# Patient Record
Sex: Female | Born: 1995 | Race: White | Hispanic: No | Marital: Married | Smoking: Never smoker
Health system: Southern US, Community
[De-identification: ages and names within clinical notes are randomized; demographics above are authoritative.]

## PROBLEM LIST (undated history)

## (undated) DIAGNOSIS — R202 Paresthesia of skin: Secondary | ICD-10-CM

## (undated) DIAGNOSIS — N319 Neuromuscular dysfunction of bladder, unspecified: Secondary | ICD-10-CM

## (undated) DIAGNOSIS — N3010 Interstitial cystitis (chronic) without hematuria: Secondary | ICD-10-CM

## (undated) DIAGNOSIS — M5416 Radiculopathy, lumbar region: Secondary | ICD-10-CM

## (undated) DIAGNOSIS — M792 Neuralgia and neuritis, unspecified: Secondary | ICD-10-CM

## (undated) DIAGNOSIS — M797 Fibromyalgia: Secondary | ICD-10-CM

## (undated) HISTORY — DX: Radiculopathy, lumbar region: M54.16

## (undated) HISTORY — DX: Fibromyalgia: M79.7

## (undated) HISTORY — DX: Interstitial cystitis (chronic) without hematuria: N30.10

## (undated) HISTORY — DX: Paresthesia of skin: R20.2

## (undated) HISTORY — PX: INTERSTIM IMPLANT PLACEMENT: SHX5130

## (undated) HISTORY — PX: BLADDER SURGERY: SHX569

## (undated) HISTORY — PX: URETHRAL DILATION: SUR417

## (undated) HISTORY — DX: Neuralgia and neuritis, unspecified: M79.2

---

## 2018-07-01 ENCOUNTER — Encounter (HOSPITAL_COMMUNITY): Payer: Self-pay | Admitting: *Deleted

## 2018-07-01 ENCOUNTER — Emergency Department (HOSPITAL_COMMUNITY)
Admission: EM | Admit: 2018-07-01 | Discharge: 2018-07-02 | Disposition: A | Discharge status: Home / Self Care | Payer: BLUE CROSS/BLUE SHIELD | Attending: Emergency Medicine | Admitting: Emergency Medicine

## 2018-07-01 ENCOUNTER — Other Ambulatory Visit: Payer: Self-pay

## 2018-07-01 DIAGNOSIS — R1084 Generalized abdominal pain: Secondary | ICD-10-CM | POA: Diagnosis present

## 2018-07-01 DIAGNOSIS — Z79899 Other long term (current) drug therapy: Secondary | ICD-10-CM | POA: Diagnosis not present

## 2018-07-01 DIAGNOSIS — R109 Unspecified abdominal pain: Secondary | ICD-10-CM

## 2018-07-01 HISTORY — DX: Neuromuscular dysfunction of bladder, unspecified: N31.9

## 2018-07-01 NOTE — ED Triage Notes (Signed)
Pt started having R flank pain yesterday. Contacted her PCP who recommended coming to the ED for r/o appendicitis, kidney stone or ovarian cyst. Pt reports pain started at umbilicus then is now RUQ and R flank. Denies vomiting, c/o nausea

## 2018-07-02 LAB — COMPREHENSIVE METABOLIC PANEL
ALBUMIN: 3.8 g/dL (ref 3.5–5.0)
ALT: 20 U/L (ref 0–44)
AST: 20 U/L (ref 15–41)
Alkaline Phosphatase: 63 U/L (ref 38–126)
Anion gap: 8 (ref 5–15)
BUN: 20 mg/dL (ref 6–20)
CO2: 28 mmol/L (ref 22–32)
Calcium: 9.3 mg/dL (ref 8.9–10.3)
Chloride: 103 mmol/L (ref 98–111)
Creatinine, Ser: 0.79 mg/dL (ref 0.44–1.00)
GFR calc Af Amer: >60 mL/min (ref >60)
GLUCOSE: 102 mg/dL — AB (ref 70–99)
POTASSIUM: 3.8 mmol/L (ref 3.5–5.1)
SODIUM: 139 mmol/L (ref 135–145)
Total Bilirubin: 0.4 mg/dL (ref 0.3–1.2)
Total Protein: 6.7 g/dL (ref 6.5–8.1)

## 2018-07-02 LAB — URINALYSIS, ROUTINE W REFLEX MICROSCOPIC
Bilirubin Urine: NEGATIVE
GLUCOSE, UA: NEGATIVE mg/dL
HGB URINE DIPSTICK: NEGATIVE
KETONES UR: NEGATIVE mg/dL
LEUKOCYTES UA: NEGATIVE
Nitrite: NEGATIVE
Protein, ur: NEGATIVE mg/dL
Specific Gravity, Urine: 1.019 (ref 1.005–1.030)
pH: 6 (ref 5.0–8.0)

## 2018-07-02 LAB — CBC
HEMATOCRIT: 37.9 % (ref 36.0–46.0)
Hemoglobin: 12.5 g/dL (ref 12.0–15.0)
MCH: 29.6 pg (ref 26.0–34.0)
MCHC: 33 g/dL (ref 30.0–36.0)
MCV: 89.6 fL (ref 78.0–100.0)
Platelets: 301 10*3/uL (ref 150–400)
RBC: 4.23 MIL/uL (ref 3.87–5.11)
RDW: 13 % (ref 11.5–15.5)
WBC: 9.9 10*3/uL (ref 4.0–10.5)

## 2018-07-02 LAB — I-STAT BETA HCG BLOOD, ED (MC, WL, AP ONLY): I-stat hCG, quantitative: <5 m[IU]/mL (ref <5)

## 2018-07-02 LAB — LIPASE, BLOOD: LIPASE: 45 U/L (ref 11–51)

## 2018-07-02 MED ORDER — CYCLOBENZAPRINE HCL 10 MG PO TABS: 10.0000 mg | ORAL_TABLET | Freq: Two times a day (BID) | ORAL | 0 refills | Status: DC | PRN

## 2018-07-02 MED ORDER — IBUPROFEN 800 MG PO TABS: 800.0000 mg | ORAL_TABLET | Freq: Three times a day (TID) | ORAL | 0 refills | Status: DC

## 2018-07-02 NOTE — ED Notes (Signed)
Pt. Unable to sign due to computer malfunction. No further questions at this time.

## 2018-07-02 NOTE — ED Provider Notes (Signed)
[facility]   CSN: [id number] Arrival date & time: 07/01/18  2325     History   Chief Complaint Chief Complaint  Patient presents with  . Flank Pain    HPI Alexis Shannon is a 22 y.o. female.  Patient presents to the emergency department with a chief complaint of right flank pain.  She states that she noticed pain in her right low back about 3 or 4 days ago.  She denies any injury or traumatic event.  She denies any hematuria, dysuria.  Denies any vaginal discharge or bleeding.  Denies any fever, chills, or vomiting.  She states that she has had some slight nausea.  She has not taken anything for symptoms.  She denies any other associated symptoms her symptoms are worsened with palpation and with movement.  The history is provided by the patient. No language interpreter was used.    Past Medical History:  Diagnosis Date  . Neurogenic bladder     There are no active problems to display for this patient.   Past Surgical History:  Procedure Laterality Date  . INTERSTIM IMPLANT PLACEMENT       OB History   None      Home Medications    Prior to Admission medications   Medication Sig Start Date End Date Taking? Authorizing Provider  EMGALITY 120 MG/ML SOAJ Inject 120 mg into the muscle every 30 (thirty) days.  06/29/18  Yes [provider]  gabapentin (NEURONTIN) 300 MG capsule Take 900 mg by mouth 3 (three) times daily. 05/27/18  Yes [provider]  rizatriptan (MAXALT) 10 MG tablet Take 10 mg by mouth as needed for migraine.  06/28/18  Yes [provider]    Family History No family history on file.  Social History Social History   Tobacco Use  . Smoking status: Never Smoker  . Smokeless tobacco: Never Used  Substance Use Topics  . Alcohol use: Never    Frequency: Never  . Drug use: Never     Allergies   Ciprofloxacin hcl   Review of Systems Review of Systems  All  other systems reviewed and are negative.    Physical Exam Updated Vital Signs BP 94/62   Pulse 69   Temp 97.8 F (36.6 C) (Oral)   Resp 17   LMP 06/14/2018   SpO2 98%   Physical Exam  Constitutional: She is oriented to person, place, and time. She appears well-developed and well-nourished.  HENT:  Head: Normocephalic and atraumatic.  Eyes: Pupils are equal, round, and reactive to light. Conjunctivae and EOM are normal.  Neck: Normal range of motion. Neck supple.  Cardiovascular: Normal rate and regular rhythm. Exam reveals no gallop and no friction rub.  No murmur heard. Pulmonary/Chest: Effort normal and breath sounds normal. No respiratory distress. She has no wheezes. She has no rales. She exhibits no tenderness.  Abdominal: Soft. Bowel sounds are normal. She exhibits no distension and no mass. There is no tenderness. There is no rebound and no guarding.  Right flank muscle tenderness to palpation No right lower abdominal tenderness  Musculoskeletal: Normal range of motion. She exhibits no edema or tenderness.  Neurological: She is alert and oriented to person, place, and time.  Skin: Skin is warm and dry.  Psychiatric: She has a normal mood and affect. Her behavior is normal. Judgment and thought content normal.  Nursing note and vitals reviewed.    ED Treatments / Results  Labs (all labs ordered are listed, but only abnormal results are displayed) Labs Reviewed  COMPREHENSIVE METABOLIC PANEL - Abnormal; Notable for the following components:      Result Value   Glucose, Bld 102 (*)    All other components within normal limits  LIPASE, BLOOD  CBC  URINALYSIS, ROUTINE W REFLEX MICROSCOPIC  I-STAT BETA HCG BLOOD, ED ([facility], [facility], [facility] ONLY)    EKG None  Radiology No results found.  Procedures Procedures (including critical care time)  Medications Ordered in ED Medications - No data to display   Initial Impression / Assessment and Plan / ED Course  I have  reviewed the triage vital signs and the nursing notes.  Pertinent labs & imaging results that were available during my care of the patient were reviewed by me and considered in my medical decision making (see chart for details).     Patient with right-sided low back tenderness to palpation.  No certain etiology.  No rash.  Vital signs are stable.  Patient is in no acute distress.  She is nontoxic-appearing.  Urinalysis negative.  Patient is not pregnant.  Doubt appendicitis, she has no McBurney point tenderness, nor does she have any focal abdominal tenderness.  Doubt UTI, his urinalysis is reassuring.  Doubt kidney stone.  Will discharge home with some Flexeril and NSAIDs.  Recommend PCP follow-up.  Final Clinical Impressions(s) / ED Diagnoses   Final diagnoses:  Flank pain    ED Discharge Orders        Ordered    cyclobenzaprine (FLEXERIL) 10 MG tablet  2 times daily PRN     07/02/18 0420    ibuprofen (ADVIL,MOTRIN) 800 MG tablet  3 times daily     07/02/18 0420       Roxy Horseman, PA-C 07/02/18 0421    Dione Booze, MD 07/02/18 [phone]

## 2018-08-24 ENCOUNTER — Encounter: Payer: Self-pay | Admitting: Gastroenterology

## 2018-09-30 ENCOUNTER — Ambulatory Visit (INDEPENDENT_AMBULATORY_CARE_PROVIDER_SITE_OTHER): Payer: BLUE CROSS/BLUE SHIELD | Admitting: Gastroenterology

## 2018-09-30 ENCOUNTER — Other Ambulatory Visit (INDEPENDENT_AMBULATORY_CARE_PROVIDER_SITE_OTHER): Payer: BLUE CROSS/BLUE SHIELD

## 2018-09-30 ENCOUNTER — Encounter

## 2018-09-30 ENCOUNTER — Encounter: Payer: Self-pay | Admitting: Gastroenterology

## 2018-09-30 VITALS — BP 90/70 | HR 68 | Ht 66.0 in | Wt 149.2 lb

## 2018-09-30 DIAGNOSIS — R1084 Generalized abdominal pain: Secondary | ICD-10-CM | POA: Diagnosis not present

## 2018-09-30 DIAGNOSIS — R140 Abdominal distension (gaseous): Secondary | ICD-10-CM

## 2018-09-30 DIAGNOSIS — R110 Nausea: Secondary | ICD-10-CM

## 2018-09-30 DIAGNOSIS — K5900 Constipation, unspecified: Secondary | ICD-10-CM

## 2018-09-30 LAB — TSH: TSH: 1.74 u[IU]/mL (ref 0.35–4.50)

## 2018-09-30 LAB — CALCIUM: Calcium: 9.4 mg/dL (ref 8.4–10.5)

## 2018-09-30 NOTE — Progress Notes (Signed)
Referring Provider: [facility] Primary Care Physician:  [facility]  Reason for Consultation:  Severe constipation   IMPRESSION:  Constipation with associated bloating not responding to medical therapy Blood in the stool in August 2019 Abdominal pain Bloating Early morning nausea Family history of colon cancer (father at age 23)  Etiology of symptoms is unclear. Differential is broad given the cascade of symptoms.   PLAN: Sitzmark study on her current regimen of Dulcolax and Miralax and probiotics Stool/symptom diary EGD and colonoscopy Trial of linaclotide 145 mcg daily after the Sitzmark study, may increase to 290 mcg if no response  I consented the patient at the bedside today discussing the risks, benefits, and alternatives to endoscopic evaluation. In particular, we discussed the risks that include, but are not limited to, reaction to medication, cardiopulmonary compromise, bleeding requiring blood transfusion, aspiration resulting in pneumonia, perforation requiring surgery, and even death. We reviewed the risk of missed lesion including polyps or even cancer. The patient acknowledges these risks and asks that we proceed.    HPI: Alexis Shannon is a 22 y.o. female with fibromyalgia, interstitial cystitis, lumbar radiculopathy, neuralgia, paresthesia of skin who is seen in consultation for constipation bloating, abdominal pain, loss of appetite, and nausea.  The history is obtained through the patient as well as referral records. Her mother accompanies her to this appointment. 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   She reports a longstanding history of constipation, first diagnosed in 3rd grade. She has avoided dairy since that time. But, this was not bothersome until the acute onset of symptoms in mid-August. Developed "severe" constipation not  responding to two dulcolax and Miralax TID, as well as Ally probiotics/prebiotics, increased fiber in the diet with increased oatmeal consumption. She is also avoiding gluten.  She will have a BM every 3-4 days. There is urge but incomplete evacuation. She has gone as long as 1 week without a bowel movement. She will require [employer] weekly when her daily regimen is not working.   Associated bloating to the point that her clothes won't fit, intermittent blood on the stool/on the toilet paper in early August, early satiety, early morning nausea and pain superior to the umbilicus. Her stools are now firm balls instead of her baseline banana shape.   Her Physical Therapist, Kayren Eaves, at [facility] told her that her cardiac sphincter was tight in her intestines.  Father died with metastatic colon cancer at age 53.  There is no other known family history of colon cancer or polyps.  No prior evaluation by gastroenterology. No prior endoscopy.    Past Medical History:  Diagnosis Date  . Fibromyalgia   . Interstitial cystitis   . Lumbar radiculopathy   . Neuralgia   . Neurogenic bladder   . Paresthesia of skin     Past Surgical History:  Procedure Laterality Date  . BLADDER SURGERY     dilation  . INTERSTIM IMPLANT PLACEMENT     part 1&2  . URETHRAL DILATION      Prior to Admission medications   Medication Sig Start Date End Date Taking? Authorizing Provider  cyclobenzaprine (FLEXERIL) 10 MG tablet Take 1 tablet (10 mg total) by mouth 2 (two) times daily as needed for muscle spasms. 07/02/18   Roxy Horseman, PA-C  EMGALITY 120 MG/ML SOAJ Inject 120 mg into the muscle every 30 (thirty) days.  06/29/18   [provider]  gabapentin (NEURONTIN) 300 MG capsule Take 900 mg by mouth 3 (three) times daily. 05/27/18   [provider]  ibuprofen (ADVIL,MOTRIN) 800 MG tablet Take 1 tablet (800 mg total) by mouth 3 (three) times daily. 07/02/18   Roxy Horseman, PA-C    mirabegron ER (MYRBETRIQ) 50 MG TB24 tablet Take 50 mg by mouth daily.    [provider]  pantoprazole (PROTONIX) 40 MG tablet Take 40 mg by mouth daily.    [provider]  promethazine (PHENERGAN) 12.5 MG tablet Take 12.5 mg by mouth every 6 (six) hours as needed for nausea or vomiting.    [provider]  rizatriptan (MAXALT) 10 MG tablet Take 10 mg by mouth as needed for migraine.  06/28/18   [provider]    Current Outpatient Medications  Medication Sig Dispense Refill  . cyclobenzaprine (FLEXERIL) 10 MG tablet Take 1 tablet (10 mg total) by mouth 2 (two) times daily as needed for muscle spasms. 20 tablet 0  . EMGALITY 120 MG/ML SOAJ Inject 120 mg into the muscle every 30 (thirty) days.   6  . gabapentin (NEURONTIN) 300 MG capsule Take 900 mg by mouth 3 (three) times daily.  6  . ibuprofen (ADVIL,MOTRIN) 800 MG tablet Take 1 tablet (800 mg total) by mouth 3 (three) times daily. 21 tablet 0  . mirabegron ER (MYRBETRIQ) 50 MG TB24 tablet Take 50 mg by mouth daily.    . pantoprazole (PROTONIX) 40 MG tablet Take 40 mg by mouth daily.    . promethazine (PHENERGAN) 12.5 MG tablet Take 12.5 mg by mouth every 6 (six) hours as needed for nausea or vomiting.    . rizatriptan (MAXALT) 10 MG tablet Take 10 mg by mouth as needed for migraine.   5   No current facility-administered medications for this visit.     Allergies as of 09/30/2018 - Review Complete 09/30/2018  Allergen Reaction Noted  . Ciprofloxacin hcl Rash 07/01/2018    Family History  Problem Relation Age of Onset  . Kidney disease Mother   . Colon cancer Father 44       mets  . Liver cancer Father   . Lung cancer Father     Social History   Socioeconomic History  . Marital status: Single    Spouse name: Not on file  . Number of children: 0  . Years of education: Not on file  . Highest education level: Not on file  Occupational History  . Not on file  Social Needs  . Financial  resource strain: Not on file  . Food insecurity:    Worry: Not on file    Inability: Not on file  . Transportation needs:    Medical: Not on file    Non-medical: Not on file  Tobacco Use  . Smoking status: Never Smoker  . Smokeless tobacco: Never Used  Substance and Sexual Activity  . Alcohol use: Never    Frequency: Never  . Drug use: Never  . Sexual activity: Not on file  Lifestyle  . Physical activity:    Days per week: Not on file    Minutes per session: Not on file  . Stress: Not on file  Relationships  . Social connections:    Talks on phone: Not on file    Gets together: Not on file    Attends religious service: Not on file    Active member of club or organization: Not on  file    Attends meetings of clubs or organizations: Not on file    Relationship status: Not on file  . Intimate partner violence:    Fear of current or ex partner: Not on file    Emotionally abused: Not on file    Physically abused: Not on file    Forced sexual activity: Not on file  Other Topics Concern  . Not on file  Social History Narrative  . Not on file    Review of Systems: 12 system ROS is negative except as noted above with the additions of anxiety, back pain, depression, fatigue headaches, menstrual pain, muscle cramps, night sweats, insomnia excessive thirst, excessive urination, urinary pain, urinary.   Physical Exam: Vital signs were reviewed. General:   Alert, well-nourished, pleasant and cooperative in NAD. She notes ongoing bloating at the time of my evaluation.  Head:  Normocephalic and atraumatic. Eyes:  Sclera clear, no icterus.   Conjunctiva pink. Mouth:  No deformity or lesions.   Neck:  Supple; no thyromegaly. Lungs:  Clear throughout to auscultation.   No wheezes.  Heart:  Regular rate and rhythm; no murmurs Abdomen:  Soft, no tympany, nontender, normal bowel sounds. No rebound or guarding. No hepatosplenomegaly. Rectal:  Deferred  Msk:  Symmetrical without gross  deformities. Extremities:  No gross deformities or edema. Neurologic:  Alert and  oriented x4;  grossly nonfocal Skin:  No rash or bruise. Psych:  Alert and cooperative. Normal mood and affect.   Cherie Lasalle L. Orvan Falconer Md, MPH [facility] 09/30/2018, 9:19 AM

## 2018-09-30 NOTE — Patient Instructions (Addendum)
Your provider has requested that you go to the basement level for lab work before leaving today. Press "B" on the elevator. The lab is located at the first door on the left as you exit the elevator.    Please return to the office on Tuesday 10/05/18 between 9:00 am-10:00 am for and abdominal xray. Go to the radiology department in the basement.   You have been scheduled for an endoscopy and colonoscopy. Please follow the written instructions given to you at your visit today. Please pick up your prep supplies at the pharmacy within the next 1-3 days. If you use inhalers (even only as needed), please bring them with you on the day of your procedure. Your physician has requested that you go to www.startemmi.com and enter the access code given to you at your visit today. This web site gives a general overview about your procedure. However, you should still follow specific instructions given to you by our office regarding your preparation for the procedure.

## 2018-10-01 ENCOUNTER — Telehealth: Payer: Self-pay | Admitting: Gastroenterology

## 2018-10-01 NOTE — Telephone Encounter (Signed)
Dr Orvan Falconer the pt swallowed sitz markers at her office visit yesterday morning around 9 am and states last night she vomited the "whole unopened capsule" at 8 PM.  She states all the markers and capsule was still intact.  Please advise

## 2018-10-01 NOTE — Telephone Encounter (Signed)
I am sorry for her difficulties. I would like for her to try to repeat the Sitz Markers at a time when her symptoms will allow. Will need to reschedule the subsequent abdominal films. Thank you.

## 2018-10-01 NOTE — Telephone Encounter (Signed)
Patient states that she was here yesterday and was given cap endo but started to vomit last night and believes that she vomit the camera. Please advise

## 2018-10-01 NOTE — Telephone Encounter (Signed)
Patient will come on Wed to get another Sitzmarks.  She will ask for [facility]

## 2018-10-01 NOTE — Telephone Encounter (Signed)
The pt states she wants to wait until after the weekend and see how she feels.  She will call on Monday to set up a time to come in for sitz markers

## 2018-10-06 ENCOUNTER — Ambulatory Visit: Payer: BLUE CROSS/BLUE SHIELD | Admitting: Family Medicine

## 2018-10-06 DIAGNOSIS — Z0289 Encounter for other administrative examinations: Secondary | ICD-10-CM

## 2018-10-12 ENCOUNTER — Ambulatory Visit (INDEPENDENT_AMBULATORY_CARE_PROVIDER_SITE_OTHER)
Admission: RE | Admit: 2018-10-12 | Discharge: 2018-10-12 | Disposition: A | Discharge status: Home / Self Care | Payer: BLUE CROSS/BLUE SHIELD | Source: Ambulatory Visit | Attending: Gastroenterology | Admitting: Gastroenterology

## 2018-10-12 DIAGNOSIS — R140 Abdominal distension (gaseous): Secondary | ICD-10-CM | POA: Diagnosis not present

## 2018-10-12 DIAGNOSIS — R110 Nausea: Secondary | ICD-10-CM

## 2018-10-12 DIAGNOSIS — K5900 Constipation, unspecified: Secondary | ICD-10-CM

## 2018-10-12 DIAGNOSIS — R1084 Generalized abdominal pain: Secondary | ICD-10-CM

## 2018-10-15 ENCOUNTER — Telehealth: Payer: Self-pay | Admitting: Gastroenterology

## 2018-10-15 ENCOUNTER — Other Ambulatory Visit: Payer: Self-pay

## 2018-10-15 MED ORDER — LINACLOTIDE 145 MCG PO CAPS: 145.0000 ug | ORAL_CAPSULE | Freq: Every day | ORAL | 1 refills | Status: DC

## 2018-10-18 ENCOUNTER — Encounter: Payer: Self-pay | Admitting: Gastroenterology

## 2018-10-18 ENCOUNTER — Ambulatory Visit (AMBULATORY_SURGERY_CENTER): Payer: BLUE CROSS/BLUE SHIELD | Admitting: Gastroenterology

## 2018-10-18 VITALS — BP 94/69 | HR 67 | Temp 98.9°F | Resp 18 | Ht 66.0 in | Wt 149.0 lb

## 2018-10-18 DIAGNOSIS — K922 Gastrointestinal hemorrhage, unspecified: Secondary | ICD-10-CM | POA: Diagnosis present

## 2018-10-18 DIAGNOSIS — K649 Unspecified hemorrhoids: Secondary | ICD-10-CM

## 2018-10-18 DIAGNOSIS — R1084 Generalized abdominal pain: Secondary | ICD-10-CM

## 2018-10-18 DIAGNOSIS — K5900 Constipation, unspecified: Secondary | ICD-10-CM

## 2018-10-18 DIAGNOSIS — K2970 Gastritis, unspecified, without bleeding: Secondary | ICD-10-CM | POA: Diagnosis not present

## 2018-10-18 MED ORDER — SODIUM CHLORIDE 0.9 % IV SOLN: 500.0000 mL | Freq: Once | INTRAVENOUS | Status: DC

## 2018-10-18 NOTE — Progress Notes (Signed)
Multiple positions tried to assist patient to pas gas.  Mother states she has reduced sensation to pass gas due to chronic constipation.  This has been an ongoing problem.  Patient educated on walking and what foods to avoid until she is able to pass gas.  Also she is aware of symptoms that would require her to call us. Patient also complains of nausea but this is the same nausea that she experiences everyday. Patient and mother agree to plan with no further questions prior to discharge.

## 2018-10-18 NOTE — Op Note (Signed)
[facility] Patient Name: Alexis Shannon Procedure Date: 10/18/2018 3:11 PM MRN: [id number] Endoscopist: Tressia Danas MD, MD Age: 22 Referring MD:  Date of Birth: [birth year] Gender: Female Account #: [id number] Procedure:                Colonoscopy Indications:              Gastrointestinal bleeding. Please see my office                            note from 09/30/2018. Medicines:                See the Anesthesia note for documentation of the                            administered medications Procedure:                Pre-Anesthesia Assessment:                           - Prior to the procedure, a History and Physical                            was performed, and patient medications and                            allergies were reviewed. The patient's tolerance of                            previous anesthesia was also reviewed. The risks                            and benefits of the procedure and the sedation                            options and risks were discussed with the patient.                            All questions were answered, and informed consent                            was obtained. Prior Anticoagulants: The patient has                            taken no previous anticoagulant or antiplatelet                            agents. ASA Grade Assessment: I - A normal, healthy                            patient. After reviewing the risks and benefits,                            the patient was deemed in satisfactory condition to  undergo the procedure.                           After obtaining informed consent, the colonoscope                            was passed under direct vision. Throughout the                            procedure, the patient's blood pressure, pulse, and                            oxygen saturations were monitored continuously. The                            Colonoscope was introduced through the anus and                             advanced to the the terminal ileum, with                            identification of the appendiceal orifice and IC                            valve. The colonoscopy was performed without                            difficulty. The patient tolerated the procedure                            well. The quality of the bowel preparation was good. Scope In: 3:26:40 PM Scope Out: 3:35:21 PM Scope Withdrawal Time: 0 hours 6 minutes 27 seconds  Total Procedure Duration: 0 hours 8 minutes 41 seconds  Findings:                 The perianal and digital rectal examinations were                            normal.                           Non-bleeding internal hemorrhoids were found during                            retroflexion.                           The exam was otherwise without abnormality on                            direct and retroflexion views. Complications:            No immediate complications. Estimated Blood Loss:     Estimated blood loss: none. Impression:               - Non-bleeding internal hemorrhoids.                           -  The examination was otherwise normal on direct                            and retroflexion views.                           - No specimens collected. Recommendation:           - Discharge patient to home.                           - Resume regular diet.                           - Continue present medications.                           - Use Linzess (linaclotide) 145 mcg PO daily.                           - Return to my office in 6 weeks. Tressia Danas MD, MD 10/18/2018 3:43:43 PM This report has been signed electronically.

## 2018-10-18 NOTE — Op Note (Signed)
[facility] Patient Name: Alexis Shannon Procedure Date: 10/18/2018 3:12 PM MRN: [id number] Endoscopist: Tressia Danas MD, MD Age: 22 Referring MD:  Date of Birth: Oct 13, 1996 Gender: Female Account #: [id number] Procedure:                Upper GI endoscopy Indications:              Abdominal bloating, Nausea. Please see my office                            note from 09/30/2018. Medicines:                See the Anesthesia note for documentation of the                            administered medications Procedure:                Pre-Anesthesia Assessment:                           - Prior to the procedure, a History and Physical                            was performed, and patient medications and                            allergies were reviewed. The patient's tolerance of                            previous anesthesia was also reviewed. The risks                            and benefits of the procedure and the sedation                            options and risks were discussed with the patient.                            All questions were answered, and informed consent                            was obtained. Prior Anticoagulants: The patient has                            taken no previous anticoagulant or antiplatelet                            agents. ASA Grade Assessment: I - A normal, healthy                            patient. After reviewing the risks and benefits,                            the patient was deemed in satisfactory condition to  undergo the procedure.                           After obtaining informed consent, the endoscope was                            passed under direct vision. Throughout the                            procedure, the patient's blood pressure, pulse, and                            oxygen saturations were monitored continuously. The                            Endoscope was introduced through the  mouth, and                            advanced to the third part of duodenum. The upper                            GI endoscopy was accomplished without difficulty.                            The patient tolerated the procedure well. Scope In: Scope Out: Findings:                 The esophagus was normal.                           The entire examined stomach was normal. Biopsies                            were taken with a cold forceps for histology.                           The examined duodenum was normal. Biopsies were                            taken with a cold forceps for histology. Complications:            No immediate complications. Estimated Blood Loss:     Estimated blood loss: none. Impression:               - Normal esophagus.                           - Normal stomach. Biopsied.                           - Normal examined duodenum. Biopsied. Recommendation:           - Await pathology results.                           -Proceed with colonoscopy today as previously  planned. Tressia Danas MD, MD 10/18/2018 3:40:29 PM This report has been signed electronically.

## 2018-10-18 NOTE — Progress Notes (Signed)
Called to room to assist during endoscopic procedure.  Patient ID and intended procedure confirmed with present staff. Received instructions for my participation in the procedure from the performing physician.  

## 2018-10-18 NOTE — Patient Instructions (Signed)
Handouts provided:  Hemorrhoids  Pickup Linzess at your pharmacy.  YOU HAD AN ENDOSCOPIC PROCEDURE TODAY AT THE [facility]:   Refer to the procedure report that was given to you for any specific questions about what was found during the examination.  If the procedure report does not answer your questions, please call your gastroenterologist to clarify.  If you requested that your care partner not be given the details of your procedure findings, then the procedure report has been included in a sealed envelope for you to review at your convenience later.  YOU SHOULD EXPECT: Some feelings of bloating in the abdomen. Passage of more gas than usual.  Walking can help get rid of the air that was put into your GI tract during the procedure and reduce the bloating. If you had a lower endoscopy (such as a colonoscopy or flexible sigmoidoscopy) you may notice spotting of blood in your stool or on the toilet paper. If you underwent a bowel prep for your procedure, you may not have a normal bowel movement for a few days.  Please Note:  You might notice some irritation and congestion in your nose or some drainage.  This is from the oxygen used during your procedure.  There is no need for concern and it should clear up in a day or so.  SYMPTOMS TO REPORT IMMEDIATELY:   Following lower endoscopy (colonoscopy or flexible sigmoidoscopy):  Excessive amounts of blood in the stool  Significant tenderness or worsening of abdominal pains  Swelling of the abdomen that is new, acute  Fever of 100F or higher   Following upper endoscopy (EGD)  Vomiting of blood or coffee ground material  New chest pain or pain under the shoulder blades  Painful or persistently difficult swallowing  New shortness of breath  Fever of 100F or higher  Black, tarry-looking stools  For urgent or emergent issues, a gastroenterologist can be reached at any hour by calling [phone].   DIET:  We do recommend a small  meal at first, but then you may proceed to your regular diet.  Drink plenty of fluids but you should avoid alcoholic beverages for 24 hours.  ACTIVITY:  You should plan to take it easy for the rest of today and you should NOT DRIVE or use heavy machinery until tomorrow (because of the sedation medicines used during the test).    FOLLOW UP: Our staff will call the number listed on your records the next business day following your procedure to check on you and address any questions or concerns that you may have regarding the information given to you following your procedure. If we do not reach you, we will leave a message.  However, if you are feeling well and you are not experiencing any problems, there is no need to return our call.  We will assume that you have returned to your regular daily activities without incident.  If any biopsies were taken you will be contacted by phone or by letter within the next 1-3 weeks.  Please call us at [phone] if you have not heard about the biopsies in 3 weeks.    SIGNATURES/CONFIDENTIALITY: You and/or your care partner have signed paperwork which will be entered into your electronic medical record.  These signatures attest to the fact that that the information above on your After Visit Summary has been reviewed and is understood.  Full responsibility of the confidentiality of this discharge information lies with you and/or your care-partner.

## 2018-10-18 NOTE — Progress Notes (Signed)
I have reviewed the patient's medical history in detail and updated the computerized patient record.

## 2018-10-18 NOTE — Progress Notes (Signed)
PT taken to PACU. Monitors in place. VSS. Report given to RN. 

## 2018-10-19 ENCOUNTER — Telehealth: Payer: Self-pay | Admitting: *Deleted

## 2018-10-19 ENCOUNTER — Telehealth: Payer: Self-pay | Admitting: Gastroenterology

## 2018-10-19 DIAGNOSIS — R1084 Generalized abdominal pain: Secondary | ICD-10-CM

## 2018-10-19 DIAGNOSIS — K5900 Constipation, unspecified: Secondary | ICD-10-CM

## 2018-10-19 NOTE — Telephone Encounter (Signed)
  Follow up Call-  Call back number 10/18/2018  Post procedure Call Back phone  # [phone]  Permission to leave phone message Yes     Patient questions:  Do you have a fever, pain , or abdominal swelling? No. Pain Score  0 *  Have you tolerated food without any problems? Yes.    Have you been able to return to your normal activities? Yes.    Do you have any questions about your discharge instructions: Diet   No. Medications  No. Follow up visit  No.  Do you have questions or concerns about your Care? No.  Actions: * If pain score is 4 or above: No action needed, pain <4.

## 2018-10-19 NOTE — Telephone Encounter (Signed)
This patient called approximately 10PM c/o chronic abdominal pain, unchanged since procedure yesterday.  Ate mashed potatoes and felt like stomach on fire.  Asking for advice and possible diagnoses.  Recently seen by you for chronic abd pain and constipation.  No further recommendations - told her I would pass message to you in AM.

## 2018-10-20 NOTE — Telephone Encounter (Signed)
Please call the patient to see how she is feeling. I am hopeful that the Linzess is starting to provide some relief. If not, please obtain details about the pain. She should already have a follow-up appointment with me. If this has not yet been scheduled, please schedule for the next couple of weeks. Thank you.

## 2018-10-22 ENCOUNTER — Telehealth: Payer: Self-pay | Admitting: Gastroenterology

## 2018-10-25 ENCOUNTER — Ambulatory Visit (INDEPENDENT_AMBULATORY_CARE_PROVIDER_SITE_OTHER)
Admission: RE | Admit: 2018-10-25 | Discharge: 2018-10-25 | Disposition: A | Discharge status: Home / Self Care | Payer: BLUE CROSS/BLUE SHIELD | Source: Ambulatory Visit | Attending: Physician Assistant | Admitting: Physician Assistant

## 2018-10-25 DIAGNOSIS — R1084 Generalized abdominal pain: Secondary | ICD-10-CM

## 2018-10-25 DIAGNOSIS — K5900 Constipation, unspecified: Secondary | ICD-10-CM

## 2018-10-25 NOTE — Telephone Encounter (Signed)
Left message on patients voicemail to call office.   

## 2018-10-25 NOTE — Addendum Note (Signed)
Addended by: Sharlee BlewBROWN, Marcelene Weidemann P on: 10/25/2018 10:09 AM   Modules accepted: Orders

## 2018-10-25 NOTE — Telephone Encounter (Signed)
Spoke with patient and added her to your schedule for tomorrow at 1:45pm. Patient states she has had continued pain since her colonoscopy, like a burning sensation after eating. She states her stomach is bloated and very tender. She was told maybe she should follow up with OBGYN but she would like an abd xray to make sure she is ok.

## 2018-10-25 NOTE — Telephone Encounter (Signed)
Spoke to patient she will come today for x ray

## 2018-10-25 NOTE — Progress Notes (Signed)
Referring Provider: No ref. provider found Primary Care Physician:  Patient, No Pcp Per   Reason for Consultation:  Abdominal pain   IMPRESSION:  Constipation with associated bloating and abdominal pain not responding to medical therapy    - symptoms first developed in 3rd grade, worse since 07/2018    - Sitz Mark study 10/13/18: Four markers in the distal colon and 1 is in the transverse colon.    - Linzess 145 mcg daily started 09/30/18    - normal colonoscopy except for internal hemorrhoids 10/18/18    - EGD 10/18/18: H pylori negative gastritis, normal duodenal biopsies    - significant stool burden on abdominal x-rays 10/25/18 Blood in the stool in August 2019 likely due to internal hemorrhoids Abdominal pain with bloating    - EGD 10/18/18: H pylori negative gastritis, normal duodenal biopsies Post-prandial "stomach" burning after endoscopy Early morning nausea Family history of colon cancer (father at age 91)    - normal colonoscopy 10/18/18    - next colonoscopy due in 2030  Impressive stool burden on abdominal films before and after colonoscopy. X-ray images were reviewed with Alexis Shannon. I am hopeful that symptoms will improve on higher dose of Linzess. Continue medication and dietary changes that she has previously made: increased water, increased fiber, probiotics, Miralax TID, dulcolax. Trial PPI for possible reflux symptoms.   PLAN: Increase Linzess to 290 mcg daily Pantoprazole 40 mg daily  Return to the clinic in 1-2 months Consider a trial of Xifaxan if not improving  HPI: Alexis Shannon is a 22 y.o. female with fibromyalgia, interstitial cystitis, lumbar radiculopathy, neuralgia, paresthesia of skin who returns in follow-up after her initial consultation 09/30/18 for constipation bloating, abdominal pain, loss of appetite, and nausea. Endoscopic evaluation 10/18/18 showed a normal EGD and normal colonoscopy with internal hemorrhoids. Gastric biopsies showed H  pylori negative chronic gastritis and duodenal biopsies were normal. She called a couple of times after the procedure with pain, bloating, and a burning sensation following endoscopy.  I have personally reviewed her abdominal films yesterday showed no evidence of dilated bowel loops or free intraperitoneal air. Moderate to large amount of colonic stool is again seen. Previously seen Sitz markers are no longer present.  Pain and burning started in stomach. Went to intestines and radiated to the back. Still feels bloated. Thinks she looks pregnant. She feels like her stomach is trying to be larger than her body frame. Not eating due to these symptoms. Lost two pounds since her initial consultation 09/30/18.  Took a laxative to have a BM. GI ROS is otherwise negative.   Past Medical History:  Diagnosis Date  . Fibromyalgia   . Interstitial cystitis   . Lumbar radiculopathy   . Neuralgia   . Neurogenic bladder   . Paresthesia of skin     Past Surgical History:  Procedure Laterality Date  . BLADDER SURGERY     dilation  . INTERSTIM IMPLANT PLACEMENT     part 1&2  . URETHRAL DILATION      Current Outpatient Medications  Medication Sig Dispense Refill  . EMGALITY 120 MG/ML SOAJ Inject 120 mg into the muscle every 30 (thirty) days.   6  . gabapentin (NEURONTIN) 300 MG capsule Take 900 mg by mouth 3 (three) times daily.  6  . promethazine (PHENERGAN) 12.5 MG tablet Take 12.5 mg by mouth every 6 (six) hours as needed for nausea or vomiting.    . rizatriptan (MAXALT) 10 MG tablet  Take 10 mg by mouth as needed for migraine.   5  . linaclotide (LINZESS) 290 MCG CAPS capsule Take 1 capsule (290 mcg total) by mouth daily before breakfast. 30 capsule 2  . pantoprazole (PROTONIX) 40 MG tablet Take 1 tablet (40 mg total) by mouth daily. 90 tablet 3   No current facility-administered medications for this visit.     Allergies as of 10/26/2018 - Review Complete 10/26/2018  Allergen Reaction Noted  .  Ciprofloxacin hcl Rash 07/01/2018    Family History  Problem Relation Age of Onset  . Kidney disease Mother   . Colon cancer Father 44       mets  . Liver cancer Father   . Lung cancer Father   . Colon polyps Father   . Esophageal cancer Paternal Uncle   . Stomach cancer Neg Hx   . Rectal cancer Neg Hx     Social History   Socioeconomic History  . Marital status: Single    Spouse name: Not on file  . Number of children: 0  . Years of education: Not on file  . Highest education level: Not on file  Occupational History  . Not on file  Social Needs  . Financial resource strain: Not on file  . Food insecurity:    Worry: Not on file    Inability: Not on file  . Transportation needs:    Medical: Not on file    Non-medical: Not on file  Tobacco Use  . Smoking status: Never Smoker  . Smokeless tobacco: Never Used  Substance and Sexual Activity  . Alcohol use: Never    Frequency: Never  . Drug use: Never  . Sexual activity: Yes    Birth control/protection: None  Lifestyle  . Physical activity:    Days per week: Not on file    Minutes per session: Not on file  . Stress: Not on file  Relationships  . Social connections:    Talks on phone: Not on file    Gets together: Not on file    Attends religious service: Not on file    Active member of club or organization: Not on file    Attends meetings of clubs or organizations: Not on file    Relationship status: Not on file  . Intimate partner violence:    Fear of current or ex partner: Not on file    Emotionally abused: Not on file    Physically abused: Not on file    Forced sexual activity: Not on file  Other Topics Concern  . Not on file  Social History Narrative  . Not on file    Review of Systems: 12 system ROS is negative except as noted above.  Filed Weights   10/26/18 1346  Weight: 148 lb 11.2 oz (67.4 kg)    Physical Exam: Vital signs were reviewed. General:   Alert, well-nourished, pleasant and  cooperative in NAD Head:  Normocephalic and atraumatic. Eyes:  Sclera clear, no icterus.   Conjunctiva pink. Lungs:  Clear throughout to auscultation.   No wheezes. Heart:  Regular rate and rhythm; no murmurs Abdomen:  Soft, mild diffuse tenderness, normal bowel sounds. No tympany. No rebound or guarding. No hepatosplenomegaly Rectal:  Deferred  Msk:  Symmetrical without gross deformities. Extremities:  No gross deformities or edema. Neurologic:  Alert and  oriented x4;  grossly nonfocal Skin:  No obvious rash or bruise. Psych:  Alert and cooperative. Normal mood and affect.   Neidy Guerrieri L.  Orvan FalconerBeavers, Md, MPH [facility] 10/27/2018, 5:43 AM

## 2018-10-25 NOTE — Telephone Encounter (Signed)
She may have an abdominal x-ray for evaluation. Hopefully it can be performed prior to her office visit. Thank you.

## 2018-10-25 NOTE — Telephone Encounter (Signed)
Called patient, left message that she needs to keep her follow up appointment with Dr. Orvan FalconerBeavers.

## 2018-10-26 ENCOUNTER — Ambulatory Visit (INDEPENDENT_AMBULATORY_CARE_PROVIDER_SITE_OTHER): Payer: BLUE CROSS/BLUE SHIELD | Admitting: Gastroenterology

## 2018-10-26 ENCOUNTER — Encounter: Payer: Self-pay | Admitting: Gastroenterology

## 2018-10-26 VITALS — BP 94/70 | HR 60 | Ht 66.0 in | Wt 148.7 lb

## 2018-10-26 DIAGNOSIS — K5900 Constipation, unspecified: Secondary | ICD-10-CM | POA: Diagnosis not present

## 2018-10-26 DIAGNOSIS — K921 Melena: Secondary | ICD-10-CM

## 2018-10-26 DIAGNOSIS — R140 Abdominal distension (gaseous): Secondary | ICD-10-CM | POA: Diagnosis not present

## 2018-10-26 DIAGNOSIS — R109 Unspecified abdominal pain: Secondary | ICD-10-CM

## 2018-10-26 DIAGNOSIS — Z800 Family history of malignant neoplasm of digestive organs: Secondary | ICD-10-CM

## 2018-10-26 MED ORDER — PANTOPRAZOLE SODIUM 40 MG PO TBEC: 40.0000 mg | DELAYED_RELEASE_TABLET | Freq: Every day | ORAL | 3 refills | Status: AC

## 2018-10-26 MED ORDER — LINACLOTIDE 290 MCG PO CAPS: 290.0000 ug | ORAL_CAPSULE | Freq: Every day | ORAL | 2 refills | Status: AC

## 2018-10-26 NOTE — Patient Instructions (Signed)
We have sent the following medications to your pharmacy for you to pick up at your convenience: Linzess 290 mcg daily  Pantoprazole 40 mg daily

## 2018-10-27 ENCOUNTER — Encounter: Payer: Self-pay | Admitting: Gastroenterology

## 2018-11-19 ENCOUNTER — Ambulatory Visit (INDEPENDENT_AMBULATORY_CARE_PROVIDER_SITE_OTHER): Payer: BLUE CROSS/BLUE SHIELD | Admitting: Gastroenterology

## 2018-11-19 ENCOUNTER — Encounter: Payer: Self-pay | Admitting: Gastroenterology

## 2018-11-19 VITALS — BP 96/76 | HR 76 | Ht 66.0 in | Wt 148.5 lb

## 2018-11-19 DIAGNOSIS — K5900 Constipation, unspecified: Secondary | ICD-10-CM | POA: Diagnosis not present

## 2018-11-19 MED ORDER — PRUCALOPRIDE SUCCINATE 2 MG PO TABS: 2.0000 mg | ORAL_TABLET | Freq: Every day | ORAL | 3 refills | Status: DC

## 2018-11-19 NOTE — Progress Notes (Signed)
Referring Provider: No ref. provider found Primary Care Physician:  Patient, No Pcp Per   Chief complaint:  Constipation   IMPRESSION:  Constipation with associated bloating not responding to medical therapy    -Initially diagnosed in third grade    -No alarm features    -Sitzmarks study 10/12/2018 showed 4 retained rings in the distal colon, 1 in the transverse colon    -Abdominal films 10/25/2018 showed a moderate to large stool burden    -Not responding to: Dulcolax, MiraLAX TID, probiotics, prebiotics, increased dietary fiber, linaclotide 145 mcg daily Blood in the stool in August 2019 attributed to internal hemorrhoids on colonoscopy 10/18/2018 Early morning nausea and bloating    -EGD 10/18/2018 showed H. pylori negative chronic inactive gastritis    -Biopsies negative for celiac Family history of colon cancer (father at age 108) Colonoscopy due 2024 given the family history of colon cancer  Chronic idiopathic constipation not responding to multiple medical therapies.  PLAN: Motegrity 2 mg daily Return to clinic in 3 months   HPI: Alexis Shannon is a 22 y.o. female who returns in follow-up after her initial consultation for constipation, abdominal pain, and bloating 09/30/2018.  A Sitzmarks study 10/12/2018 shows 5 retained Sitzmarks rings, 4 in the distal colon and 1 in the transverse colon.  The bowel gas pattern on that study was normal.An EGD and colonoscopy were performed 10/18/2018.  The upper endoscopy was normal.  Duodenal biopsies were normal.  Gastric biopsies showed H. pylori negative chronic inactive gastritis.  The colonoscopy revealed nonbleeding internal hemorrhoids.  The patient was instructed to use linaclotide 145 mcg daily.  She reported significant abdominal pain, bloating, nausea, and constipation throughout the week following her colonoscopy.  Abdominal films showed no acute findings but did reveal a moderate to large stool burden.  She returns today  having ongoing constipation.  Having hard stool balls. Some days she will have more stool.  Increased Linzess dose provided no significant relief.  She will use a laxative before bed when needed. No formed or soft BM. Sticky and they float. No flatus. Some eructation. No change in bloating.  Drinking at least 100 ounces of water daily. No regular exercise due to neurologic conditions. Fleet suppositories are not helping. Dulcolax suppository made one BM - may cause urgency but no stool output.  No other new features.  No identified exacerbating or relieving features.  She remains motivated to find relief.  Past Medical History:  Diagnosis Date  . Fibromyalgia   . Interstitial cystitis   . Lumbar radiculopathy   . Neuralgia   . Neurogenic bladder   . Paresthesia of skin     Past Surgical History:  Procedure Laterality Date  . BLADDER SURGERY     dilation  . INTERSTIM IMPLANT PLACEMENT     part 1&2  . URETHRAL DILATION      Current Outpatient Medications  Medication Sig Dispense Refill  . EMGALITY 120 MG/ML SOAJ Inject 120 mg into the muscle every 30 (thirty) days.   6  . gabapentin (NEURONTIN) 300 MG capsule Take 900 mg by mouth 3 (three) times daily.  6  . linaclotide (LINZESS) 290 MCG CAPS capsule Take 1 capsule (290 mcg total) by mouth daily before breakfast. 30 capsule 2  . ondansetron (ZOFRAN-ODT) 8 MG disintegrating tablet Take 1 tablet by mouth every 8 (eight) hours as needed.  1  . pantoprazole (PROTONIX) 40 MG tablet Take 1 tablet (40 mg total) by mouth daily. 90 tablet  3  . promethazine (PHENERGAN) 12.5 MG tablet Take 12.5 mg by mouth every 6 (six) hours as needed for nausea or vomiting.    . rizatriptan (MAXALT) 10 MG tablet Take 10 mg by mouth as needed for migraine.   5   No current facility-administered medications for this visit.     Allergies as of 11/19/2018 - Review Complete 11/19/2018  Allergen Reaction Noted  . Ciprofloxacin hcl Rash 07/01/2018    Family  History  Problem Relation Age of Onset  . Kidney disease Mother   . Colon cancer Father 44       mets  . Liver cancer Father   . Lung cancer Father   . Colon polyps Father   . Esophageal cancer Paternal Uncle   . Stomach cancer Neg Hx   . Rectal cancer Neg Hx     Social History   Socioeconomic History  . Marital status: Single    Spouse name: Not on file  . Number of children: 0  . Years of education: Not on file  . Highest education level: Not on file  Occupational History  . Not on file  Social Needs  . Financial resource strain: Not on file  . Food insecurity:    Worry: Not on file    Inability: Not on file  . Transportation needs:    Medical: Not on file    Non-medical: Not on file  Tobacco Use  . Smoking status: Never Smoker  . Smokeless tobacco: Never Used  Substance and Sexual Activity  . Alcohol use: Never    Frequency: Never  . Drug use: Never  . Sexual activity: Yes    Birth control/protection: None  Lifestyle  . Physical activity:    Days per week: Not on file    Minutes per session: Not on file  . Stress: Not on file  Relationships  . Social connections:    Talks on phone: Not on file    Gets together: Not on file    Attends religious service: Not on file    Active member of club or organization: Not on file    Attends meetings of clubs or organizations: Not on file    Relationship status: Not on file  . Intimate partner violence:    Fear of current or ex partner: Not on file    Emotionally abused: Not on file    Physically abused: Not on file    Forced sexual activity: Not on file  Other Topics Concern  . Not on file  Social History Narrative  . Not on file    Review of Systems: 12 system ROS is negative except as noted above.  Filed Weights   11/19/18 1413  Weight: 148 lb 8 oz (67.4 kg)    Physical Exam: Vital signs were reviewed. General:   Alert, well-nourished, pleasant and cooperative in NAD Lungs:  Clear throughout to  auscultation.   No wheezes. Heart:  Regular rate and rhythm; no murmurs Abdomen:  Soft, nontender, normal bowel sounds. No rebound or guarding. No hepatosplenomegaly Msk:  Symmetrical without gross deformities. Extremities:  No gross deformities or edema. Neurologic:  Alert and  oriented x4;  grossly nonfocal Skin:  No rash or bruise. Psych:  Alert and cooperative. Normal mood and affect.   Aftan Vint L. Orvan FalconerBeavers, MD, MPH [facility] 11/19/2018, 2:28 PM

## 2018-11-25 ENCOUNTER — Encounter: Payer: Self-pay | Admitting: Gastroenterology

## 2018-12-15 ENCOUNTER — Telehealth: Payer: Self-pay | Admitting: Gastroenterology

## 2018-12-15 MED ORDER — PRUCALOPRIDE SUCCINATE 2 MG PO TABS: 2.0000 mg | ORAL_TABLET | Freq: Every day | ORAL | 3 refills | Status: AC

## 2018-12-15 NOTE — Telephone Encounter (Signed)
Pt would like to speak with you, she said that she finished motegrity few days ago.

## 2018-12-15 NOTE — Telephone Encounter (Signed)
Left message on patients voicemail to call office.   

## 2018-12-15 NOTE — Telephone Encounter (Signed)
Spoke to patient, she is doing well on Motegrity, it seems to be the only thing that has worked for her. Unfortunately her insurance will not cover it, we do not have samples at the moment but I will work on getting her some ASAP.

## 2019-01-11 NOTE — Telephone Encounter (Signed)
Left detailed message on voicemail, patient can come pick up samples.

## 2019-04-27 IMAGING — DX DG ABDOMEN 2V
2 series · 2 of 2 positions shown · non-contrast
Comparison: 10/12/2018

CLINICAL DATA: Abdominal pain and bloating. Nausea and
constipation. 1 week status post colonoscopy.

EXAM:
ABDOMEN - 2 VIEW

[abdomen erect]
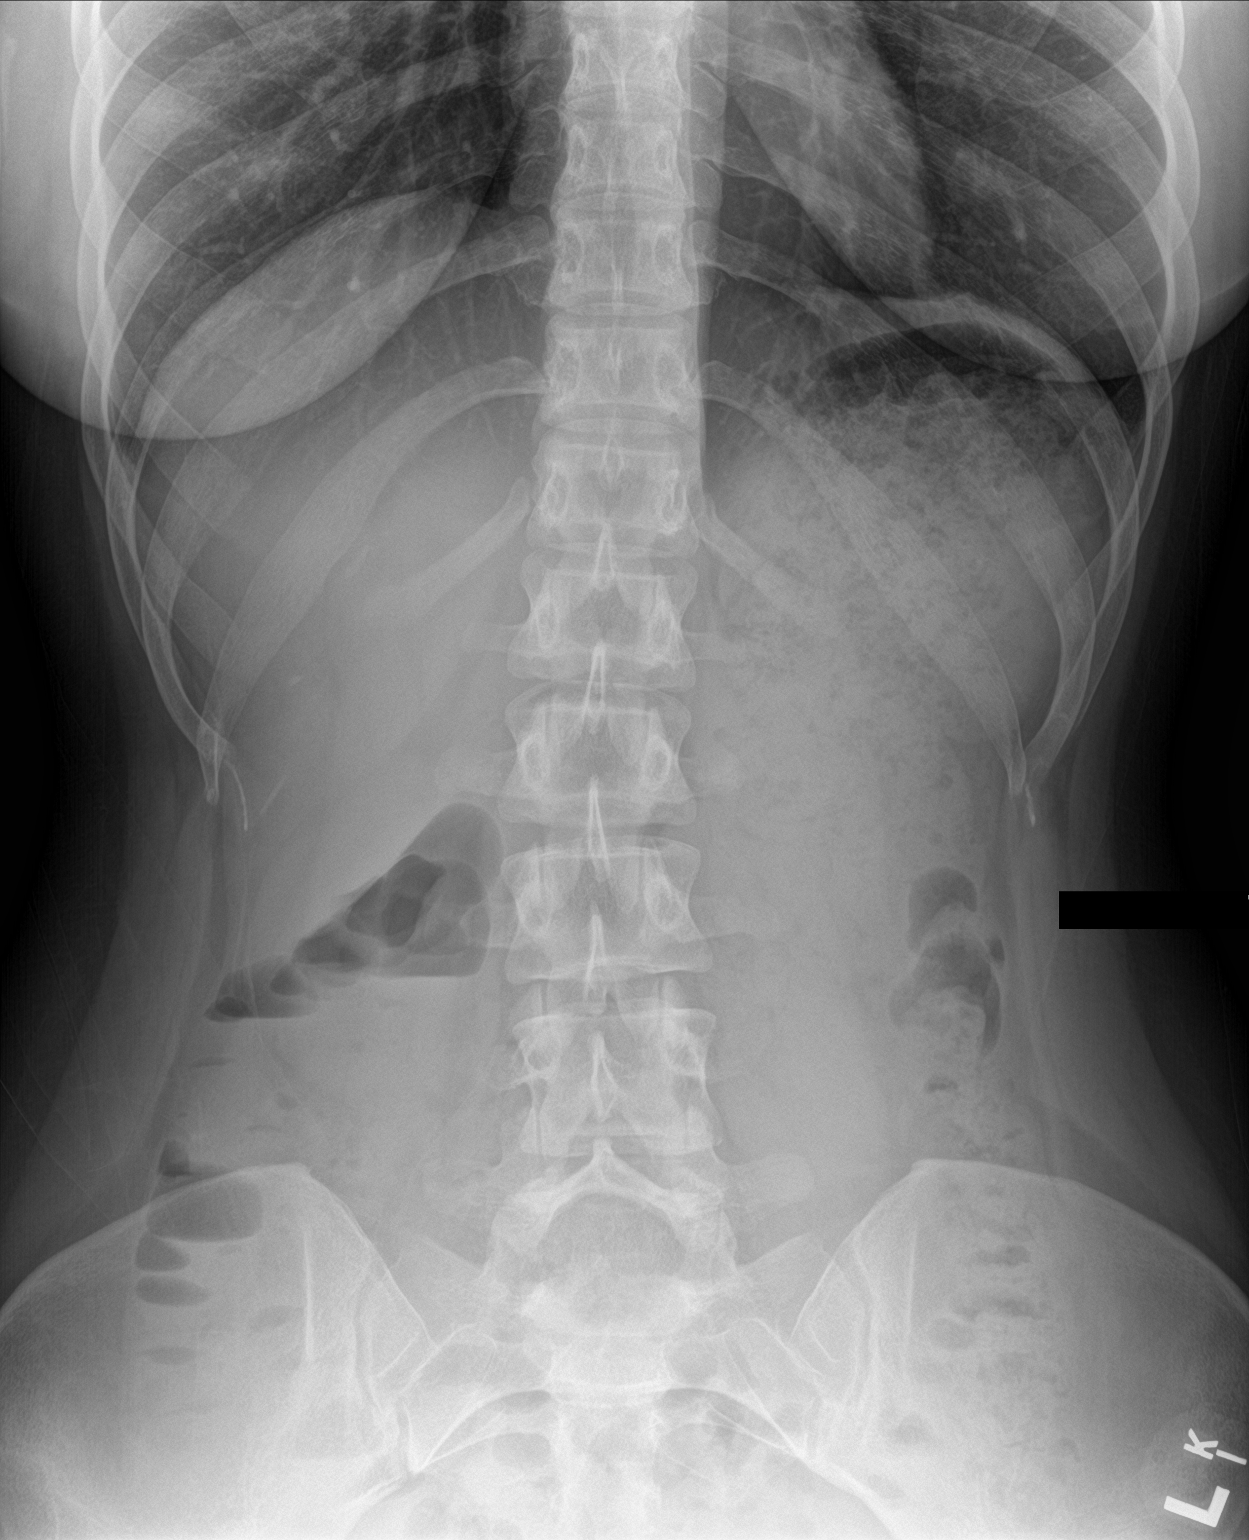

[abdomen supine]
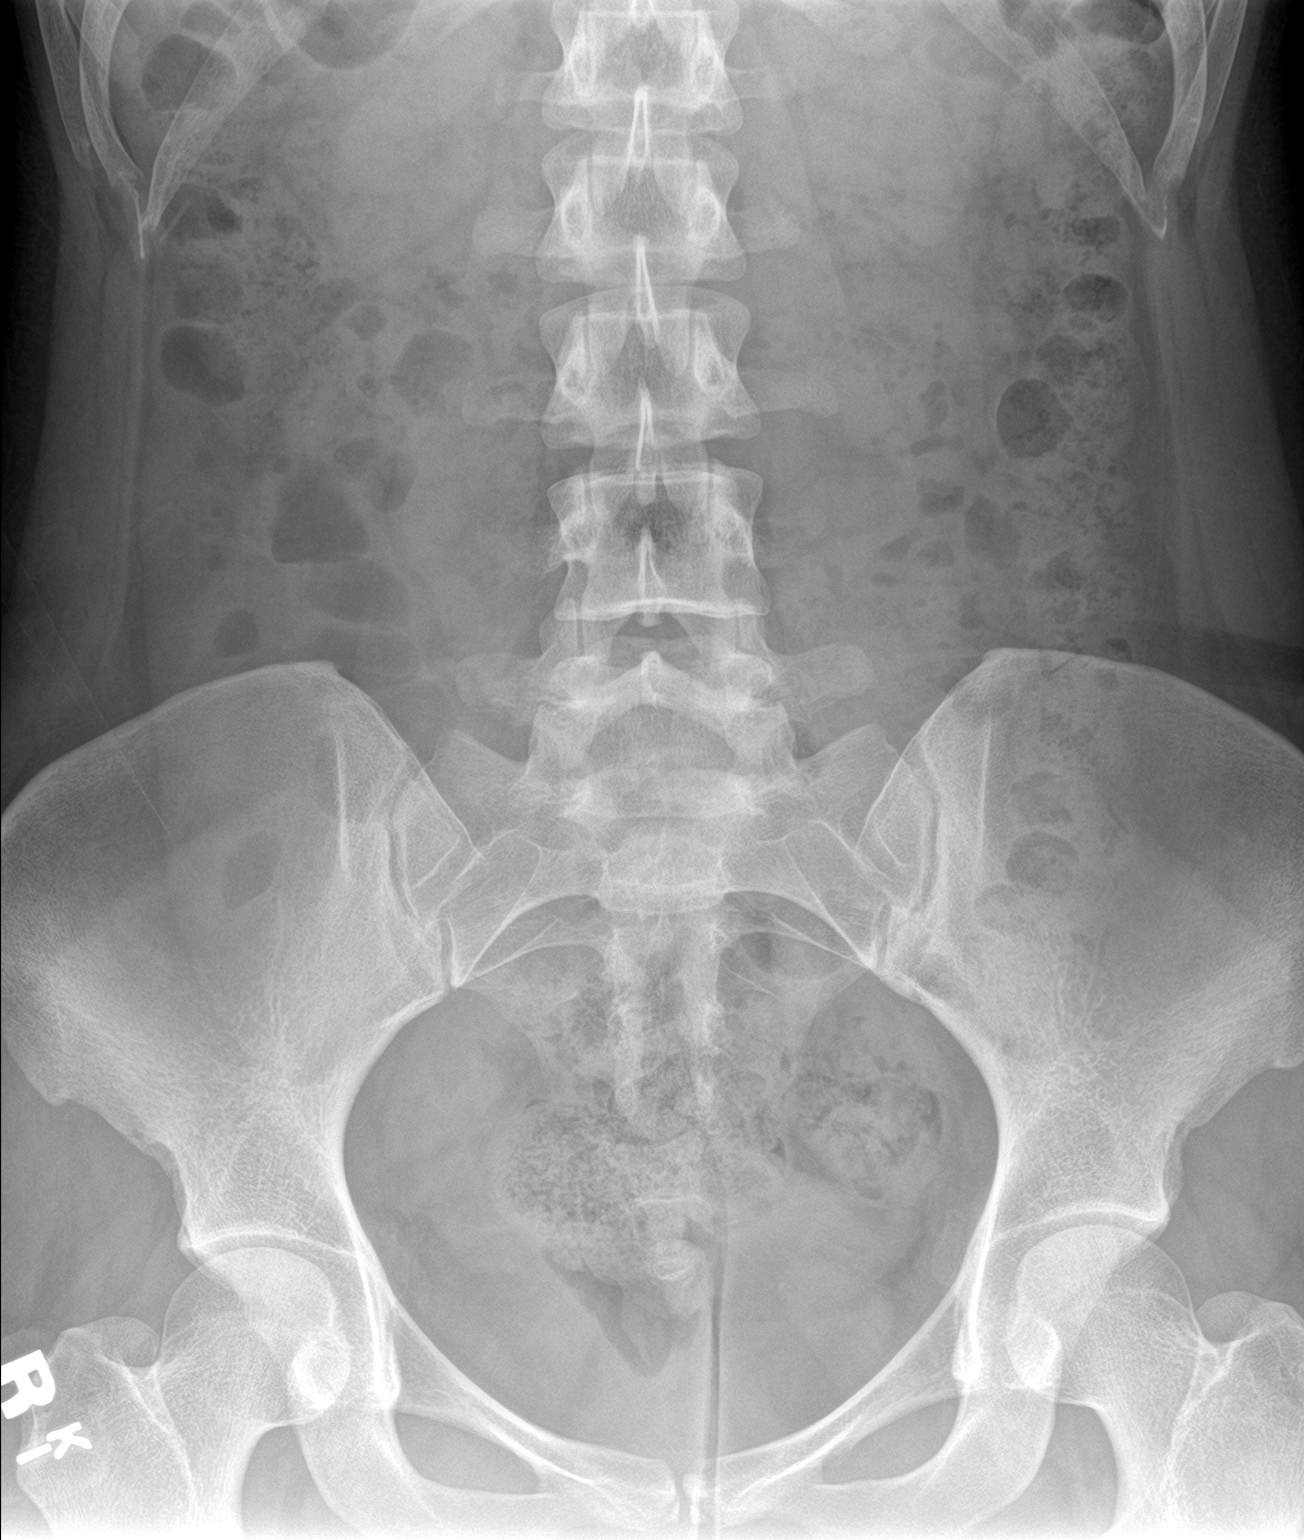

[2 of 2 positions shown; findings below may reference images not displayed]

FINDINGS: No evidence of dilated bowel loops or free intraperitoneal air.
Moderate to large amount of colonic stool is again seen. Previously
seen Sitz markers are no longer present.
IMPRESSION: No acute findings.  Moderate to large stool burden noted.

## 2019-06-14 ENCOUNTER — Telehealth: Payer: Self-pay | Admitting: Gastroenterology

## 2019-06-14 NOTE — Telephone Encounter (Signed)
Left message on patients voicemail to call office.   

## 2019-08-12 ENCOUNTER — Other Ambulatory Visit: Payer: Self-pay | Admitting: *Deleted

## 2019-08-12 ENCOUNTER — Telehealth: Payer: Self-pay | Admitting: Gastroenterology

## 2019-08-12 MED ORDER — LUBIPROSTONE 24 MCG PO CAPS: 24.0000 ug | ORAL_CAPSULE | Freq: Two times a day (BID) | ORAL | 3 refills | Status: AC

## 2019-08-12 NOTE — Telephone Encounter (Signed)
Spoke to patient, patient notified of prescription sent to pharmacy.

## 2019-08-12 NOTE — Telephone Encounter (Signed)
Pt reported that she has had severe rectal bleeding and constipation.  She stated that she feels "pressure in her vagina as if her vagina will be pushed out with every BM."    She also informed that she has moved to [facility] for school and would like to be referred to a closer GI MD or possibly do a virtual visit.

## 2019-08-12 NOTE — Telephone Encounter (Signed)
Left message for the patient to call back or send a patient message specifying what pharmacy the prescription should be sent to.

## 2019-08-12 NOTE — Telephone Encounter (Signed)
Spoke to this patient who reports continued chronic constipation. The patient reports today she had a BM that was extremely hard and during the process of bearing down to defecate, she was afraid of prolapsing her vagina. The patient reports severe rectal pain during and after defecation for a few hours. She reports BRB on tissues, on the surface of and in her stool and rectal bleeding immediately following stooling. The patient states she has tried all OTC therapies (miralax, dulcolax, fleets, fiber, etc.) for constipation and has failed, Motegrity is too expensive, her water intake is at least 100 mL daily and she runs 2-3 miles 5 days per week. Please advise.   FYI-Patient wants to be referred to a GI provider closer to her as she is 3 hours away. The patient was told to reach out to her insurance to see who is in network at her location. Patient reported she would provide this information.

## 2019-08-12 NOTE — Telephone Encounter (Signed)
She could try lubiprostone 24 mcg twice daily #30 with 3 refills. Thank you.

## 2023-05-09 ENCOUNTER — Ambulatory Visit
Admission: EM | Admit: 2023-05-09 | Discharge: 2023-05-09 | Disposition: A | Discharge status: Home / Self Care | Payer: Medicare Other | Attending: Internal Medicine | Admitting: Internal Medicine

## 2023-05-09 DIAGNOSIS — R350 Frequency of micturition: Secondary | ICD-10-CM

## 2023-05-09 DIAGNOSIS — R1030 Lower abdominal pain, unspecified: Secondary | ICD-10-CM

## 2023-05-09 DIAGNOSIS — N898 Other specified noninflammatory disorders of vagina: Secondary | ICD-10-CM | POA: Diagnosis not present

## 2023-05-09 LAB — POCT URINALYSIS DIP (MANUAL ENTRY)
Bilirubin, UA: NEGATIVE
Blood, UA: NEGATIVE
Glucose, UA: NEGATIVE mg/dL
Ketones, POC UA: NEGATIVE mg/dL
Leukocytes, UA: NEGATIVE
Nitrite, UA: NEGATIVE
Protein Ur, POC: NEGATIVE mg/dL
Spec Grav, UA: 1.015 (ref 1.010–1.025)
Urobilinogen, UA: 0.2 E.U./dL
pH, UA: 6.5 (ref 5.0–8.0)

## 2023-05-09 LAB — POCT URINE PREGNANCY: Preg Test, Ur: NEGATIVE

## 2023-05-09 MED ORDER — CEFDINIR 300 MG PO CAPS: 300.0000 mg | ORAL_CAPSULE | Freq: Two times a day (BID) | ORAL | 0 refills | Status: AC

## 2023-05-09 MED ORDER — CLINDAMYCIN PHOSPHATE 2 % VA CREA: 1.0000 | TOPICAL_CREAM | Freq: Every day | VAGINAL | 0 refills | Status: AC

## 2023-05-09 NOTE — ED Triage Notes (Signed)
Pt c/o urinary frequency, abd cramping starting two days ago and vaginal discomfort starting today.

## 2023-05-09 NOTE — ED Provider Notes (Signed)
[facility]  Note:  This document was prepared using Dragon voice recognition software and may include unintentional dictation errors.  MRN: [id number] DOB: [birth year] DATE: 05/09/23   Subjective:  Chief Complaint:  Chief Complaint  Patient presents with   Urinary Frequency     HPI: Alexis Shannon is a 27 y.o. female presenting for lower abdominal pain and increased urinary frequency for the past 2 days. Patient states that she noticed 2 days ago that she started going to the bathroom more frequently to urinate and had an increased urge. Yesterday, she started developing suprapubic pain. She states she lost her appetite due to the pain. 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 She gets several UTIs a year, but she typically has burning with her UTIs. She reports radiation into her lower back as well now. She also reports some vaginal irritation and increased clear discharge. Denies fever, nausea/vomiting, hematuria, flank pain. Endorses suprapubic pain, frequent urination, vaginal irritation. Presents NAD.  Prior to Admission medications   Medication Sig Start Date End Date Taking? Authorizing Provider  cefdinir (OMNICEF) 300 MG capsule Take 1 capsule (300 mg total) by mouth 2 (two) times daily for 7 days. 05/09/23 05/16/23 Yes Abilene Mcphee P, PA-C  clindamycin (CLEOCIN) 2 % vaginal cream Place 1 Applicatorful vaginally at bedtime for 7 days. 05/09/23 05/16/23 Yes Tiant Peixoto P, PA-C  valACYclovir (VALTREX) 500 MG tablet Take 500 mg by mouth 2 (two) times daily.   Yes [provider]  EMGALITY 120 MG/ML SOAJ Inject 120 mg into the muscle every 30 (thirty) days.  06/29/18   [provider]  gabapentin (NEURONTIN) 300 MG capsule Take 900 mg by mouth 3 (three) times daily. 05/27/18   [provider]  linaclotide Karlene Einstein) 290 MCG CAPS capsule Take 1 capsule (290 mcg total) by mouth daily before breakfast. 10/26/18   Tressia Danas,  MD  lubiprostone (AMITIZA) 24 MCG capsule Take 1 capsule (24 mcg total) by mouth 2 (two) times daily with a meal. 08/12/19   Tressia Danas, MD  ondansetron (ZOFRAN-ODT) 8 MG disintegrating tablet Take 1 tablet by mouth every 8 (eight) hours as needed. 10/04/18   [provider]  pantoprazole (PROTONIX) 40 MG tablet Take 1 tablet (40 mg total) by mouth daily. 10/26/18   Tressia Danas, MD  promethazine (PHENERGAN) 12.5 MG tablet Take 12.5 mg by mouth every 6 (six) hours as needed for nausea or vomiting.    [provider]  Prucalopride Succinate (MOTEGRITY) 2 MG TABS Take 2 mg by mouth daily. 12/15/18   Tressia Danas, MD  rizatriptan (MAXALT) 10 MG tablet Take 10 mg by mouth as needed for migraine.  06/28/18   [provider]     Allergies  Allergen Reactions   Ciprofloxacin Hcl Rash    History:   Past Medical History:  Diagnosis Date   Fibromyalgia    Interstitial cystitis    Lumbar radiculopathy    Neuralgia    Neurogenic bladder    Paresthesia of skin      Past Surgical History:  Procedure Laterality Date   BLADDER SURGERY     dilation   INTERSTIM IMPLANT PLACEMENT     part 1&2   URETHRAL DILATION      Family History  Problem Relation Age of Onset   Kidney disease Mother    Colon cancer Father 58       mets   Liver cancer Father    Lung cancer Father  Colon polyps Father    Esophageal cancer Paternal Uncle    Stomach cancer Neg Hx    Rectal cancer Neg Hx     Social History   Tobacco Use   Smoking status: Never   Smokeless tobacco: Never  Substance Use Topics   Alcohol use: Never   Drug use: Never    Review of Systems  Constitutional:  Positive for appetite change. Negative for fever.  Gastrointestinal:  Positive for abdominal pain. Negative for nausea and vomiting.  Genitourinary:  Positive for frequency, urgency and vaginal discharge. Negative for dysuria, flank pain, hematuria, vaginal bleeding and vaginal pain.   Musculoskeletal:  Positive for back pain.     Objective:   Vitals: BP 98/66 (BP Location: Right Arm)   Pulse 74   Temp 98.1 F (36.7 C) (Oral)   Resp 17   LMP 05/01/2023 (Exact Date) Comment: pt is breastfeeding.  SpO2 97%   Physical Exam Constitutional:      General: She is not in acute distress.    Appearance: Normal appearance. She is well-developed and overweight. She is not ill-appearing or toxic-appearing.  HENT:     Head: Normocephalic and atraumatic.  Cardiovascular:     Rate and Rhythm: Normal rate and regular rhythm.     Heart sounds: Normal heart sounds.  Pulmonary:     Effort: Pulmonary effort is normal.     Breath sounds: Normal breath sounds.     Comments: Clear to auscultation bilaterally   Abdominal:     General: Bowel sounds are normal.     Palpations: Abdomen is soft.     Tenderness: There is no abdominal tenderness. There is no right CVA tenderness or left CVA tenderness.     Comments: No acute abdomen  Musculoskeletal:     Lumbar back: Normal.  Skin:    General: Skin is warm and dry.  Neurological:     General: No focal deficit present.     Mental Status: She is alert.  Psychiatric:        Mood and Affect: Mood and affect normal.     Results:  Labs: Results for orders placed or performed during the hospital encounter of 05/09/23 (from the past 24 hour(s))  POCT urinalysis dipstick     Status: None   Collection Time: 05/09/23  9:05 AM  Result Value Ref Range   Color, UA yellow yellow   Clarity, UA clear clear   Glucose, UA negative negative mg/dL   Bilirubin, UA negative negative   Ketones, POC UA negative negative mg/dL   Spec Grav, UA 8.657 8.469 - 1.025   Blood, UA negative negative   pH, UA 6.5 5.0 - 8.0   Protein Ur, POC negative negative mg/dL   Urobilinogen, UA 0.2 0.2 or 1.0 E.U./dL   Nitrite, UA Negative Negative   Leukocytes, UA Negative Negative  POCT urine pregnancy     Status: None   Collection Time: 05/09/23  9:05 AM   Result Value Ref Range   Preg Test, Ur Negative Negative    Radiology: No results found.   UC Course/Treatments:  Procedures: Procedures   Medications Ordered in UC: Medications - No data to display   Assessment and Plan :     ICD-10-CM   1. Lower abdominal pain  R10.30 Urine Culture    Urine Culture    2. Vaginal irritation  N89.8     3. Frequent urination  R35.0      Lower abdominal pain Afebrile, nontoxic-appearing,  NAD. VSS. DDX includes but not limited to: Cystitis, BV, candidiasis, ovarian cyst, ovarian torsion UA was unremarkable and UPT was negative today in office.  Urine culture and cytology are pending.  Given patient's history of frequent UTIs and bladder surgery in the past, Cefdinir 300 mg twice daily was prescribed.  If urine culture is negative, ABX can be stopped at that time.  She was also given clindamycin vaginal gel 2% daily for possible BV while waiting for her cytology results.  Strict ED precautions were given and patient verbalized understanding.  Vaginal irritation Afebrile, nontoxic-appearing, NAD. VSS. DDX includes but not limited to: BV, candidiasis, gonorrhea, chlamydia Cytology is pending.  Clindamycin 2% vaginal gel daily was prescribed.  Will adjust medication as needed when cytology results return strict ED precautions were given and patient verbalized understanding.  Frequent urination Afebrile, nontoxic-appearing, NAD. VSS. DDX includes but not limited to: Cystitis, IC, pyelonephritis UA and UPT were negative today in office.  Urine culture is pending.  Given patient's history of frequent UTIs and bladder surgery in the past, Cefdinir 300 mg twice daily was prescribed.  If urine culture is negative, ABX can be stopped at that time. Strict ED precautions were given and patient verbalized understanding.   ED Discharge Orders          Ordered    cefdinir (OMNICEF) 300 MG capsule  2 times daily        05/09/23 0910    clindamycin  (CLEOCIN) 2 % vaginal cream  Daily at bedtime        05/09/23 0913             I have reviewed the PDMP during this encounter.     Cynda Acres, PA-C 05/09/23 [id number]

## 2023-05-09 NOTE — Discharge Instructions (Addendum)
Your urine sample was sent for further testing. You were prescribed Cefdinir which is an antibiotic that is often used to treat urinary tract infections.  Take the prescription as directed. A urine culture has been sent to the lab for further testing.  We will call you with those results.  If the prescription needs to be changed, it will be done so at that time. Return in 2 to 3 days if no improvement. Please go directly to the Emergency Department immediately should you begin to have any of the following symptoms: persistent fevers, increased pain or persistent nausea/vomiting.  Your swab was sent to the lab for further testing.  You will be called with results. Clindamycin is an antibiotic given to treat vaginal infections. Take the prescription as directed.  You should avoid all sexual activity until you have been notified of all your results and have undergone any necessary treatment.  If you are positive, it is recommended that you inform all sexual partners so they can treat be treated as well before having sex again.

## 2023-05-10 LAB — URINE CULTURE: Culture: >=100000 — AB

## 2023-05-11 ENCOUNTER — Telehealth: Payer: Self-pay | Admitting: Emergency Medicine

## 2023-05-11 LAB — URINE CULTURE

## 2023-05-11 MED ORDER — NITROFURANTOIN MONOHYD MACRO 100 MG PO CAPS: 100.0000 mg | ORAL_CAPSULE | Freq: Two times a day (BID) | ORAL | 0 refills | Status: AC

## 2023-05-11 NOTE — Telephone Encounter (Signed)
Telephone call from cytology lab that they will not be able to run cyto lab for patient due to mislabel.  Attempted to reach patient Alexis Shannon, Alexis Shannon

## 2023-05-11 NOTE — Telephone Encounter (Signed)
Patient returned call and will return for cyto recollect, nurse visit

## 2023-05-14 LAB — CERVICOVAGINAL ANCILLARY ONLY
Bacterial Vaginitis (gardnerella): POSITIVE — AB
Candida Glabrata: NEGATIVE
Candida Vaginitis: NEGATIVE
Chlamydia: NEGATIVE
Comment: NEGATIVE
Comment: NEGATIVE
Comment: NEGATIVE
Comment: NEGATIVE
Comment: NEGATIVE
Comment: NORMAL
Neisseria Gonorrhea: NEGATIVE
Trichomonas: NEGATIVE
